# Patient Record
Sex: Male | Born: 1995 | Race: White | Hispanic: No | Marital: Single
Health system: Southern US, Community
[De-identification: ages and names within clinical notes are randomized; demographics above are authoritative.]

---
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
## 2016-03-02 IMAGING — CR DG CHEST 2V
2 series · 2 of 2 positions shown · non-contrast
Comparison: None.

CLINICAL DATA: Chest swelling

EXAM:
CHEST  2 VIEW

[w chest pa]
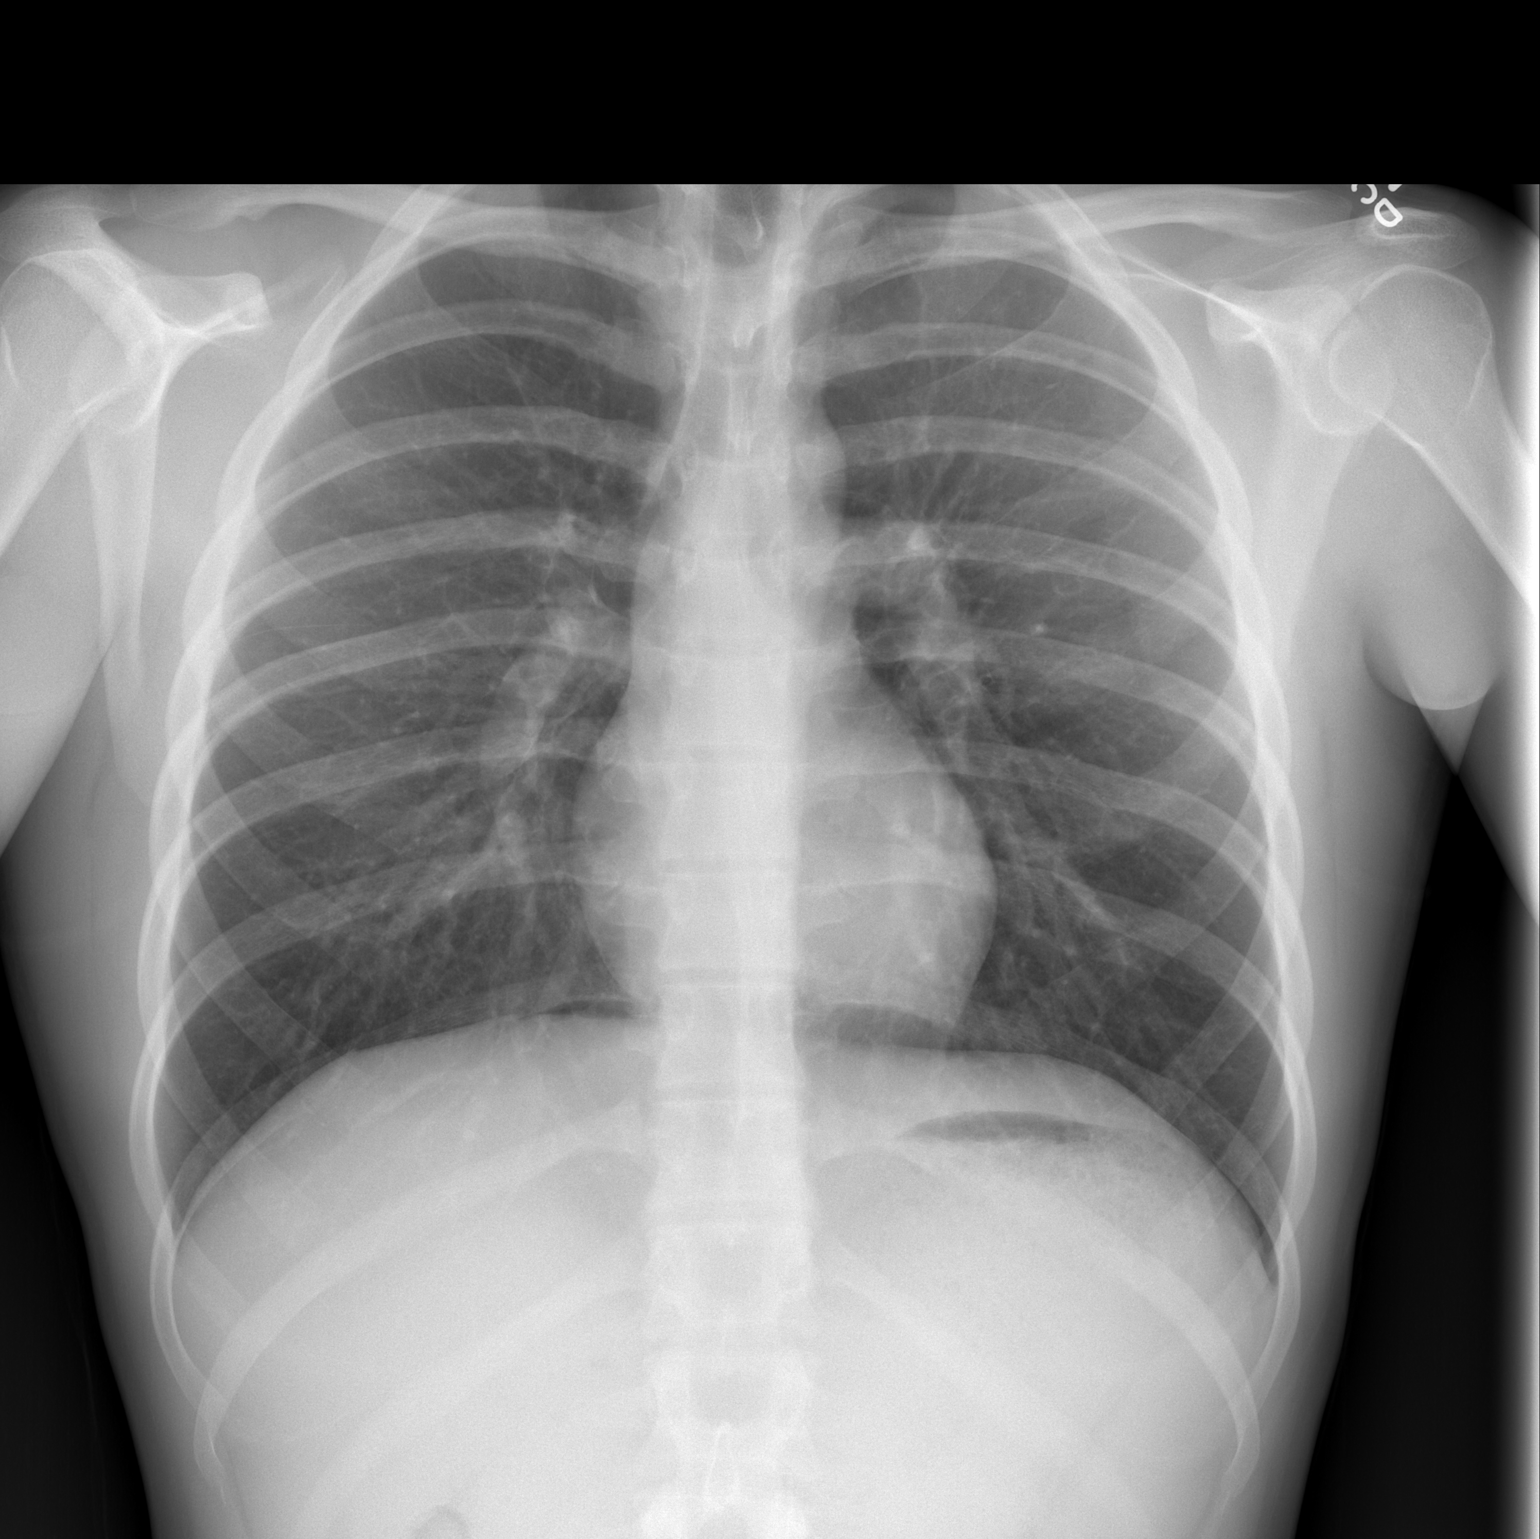

[w chest lat]
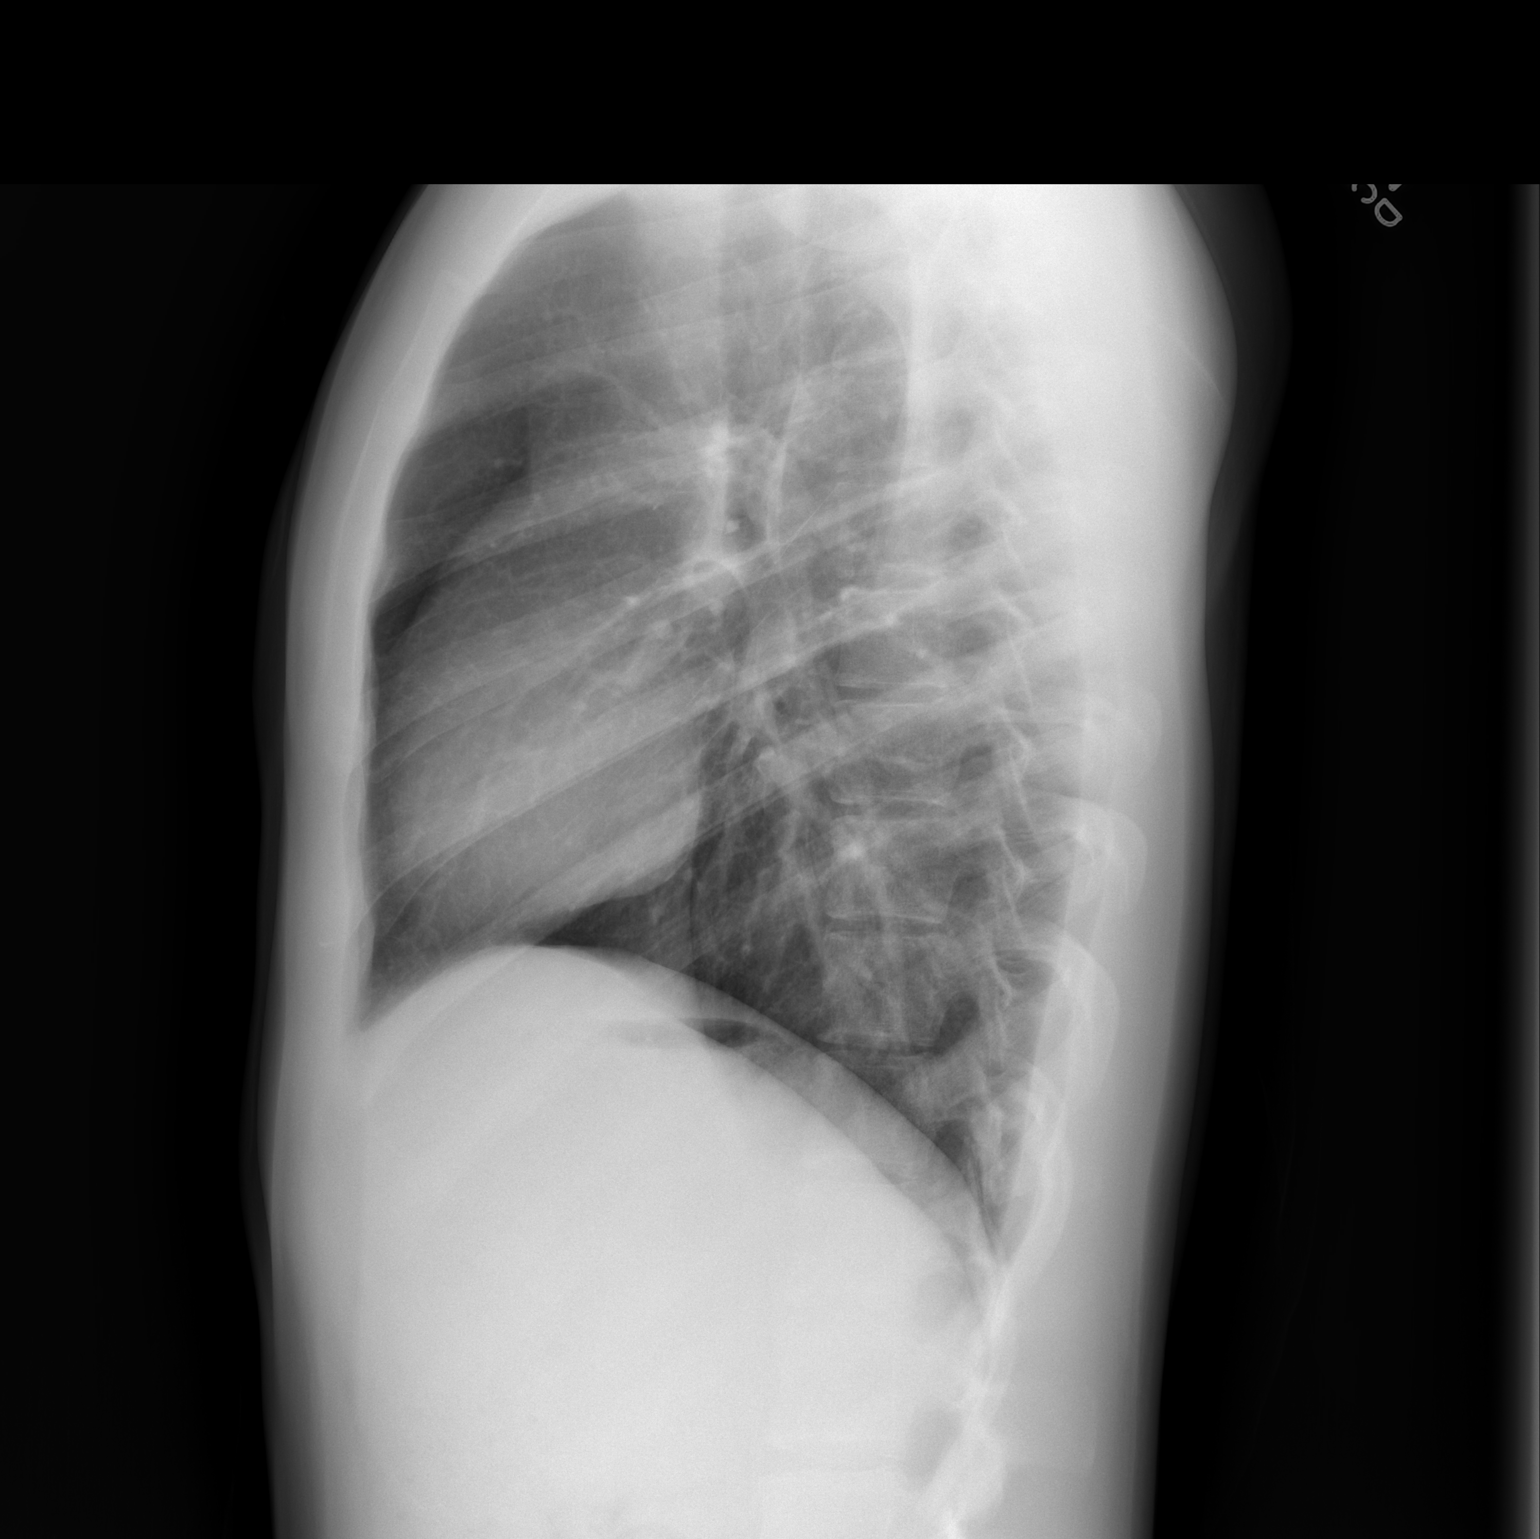

[2 of 2 positions shown; findings below may reference images not displayed]

FINDINGS: Lungs are clear. Heart size and pulmonary vascularity are normal. No
adenopathy. No pneumothorax. No bone near soft tissue abnormality
noted.
IMPRESSION: No abnormality noted.

## 2016-03-06 IMAGING — US US CHEST/MEDIASTINUM
1 series · 13 of 13 positions shown · non-contrast
Comparison: None.

CLINICAL DATA: Left-sided chest wall swelling and discomfort

EXAM:
CHEST ULTRASOUND

[Series 1: us chest/mediastinum · 0.06mm/px · 13 of 13 slices shown]
[im 1/13]
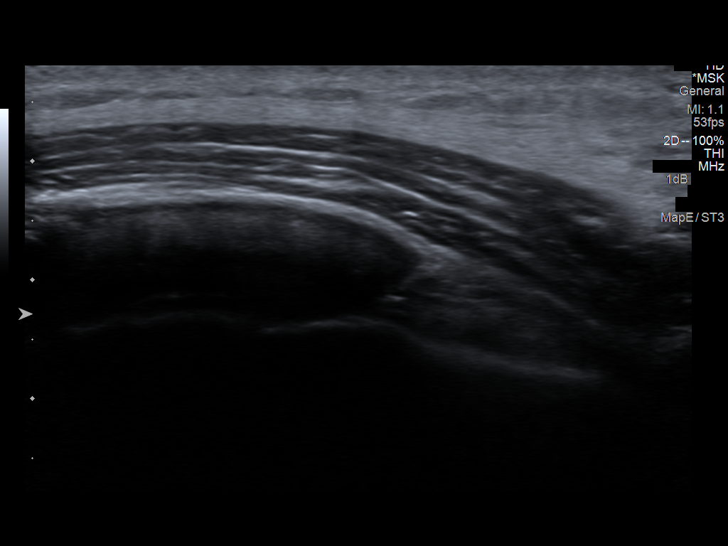
[im 2/13]
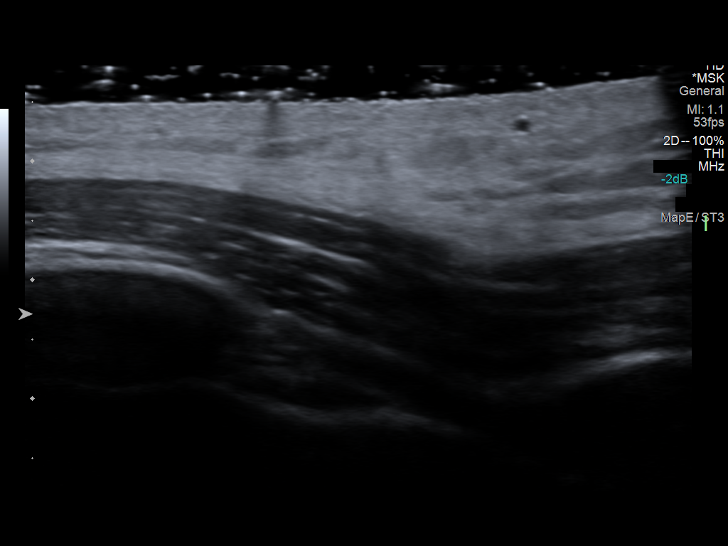
[im 3/13]
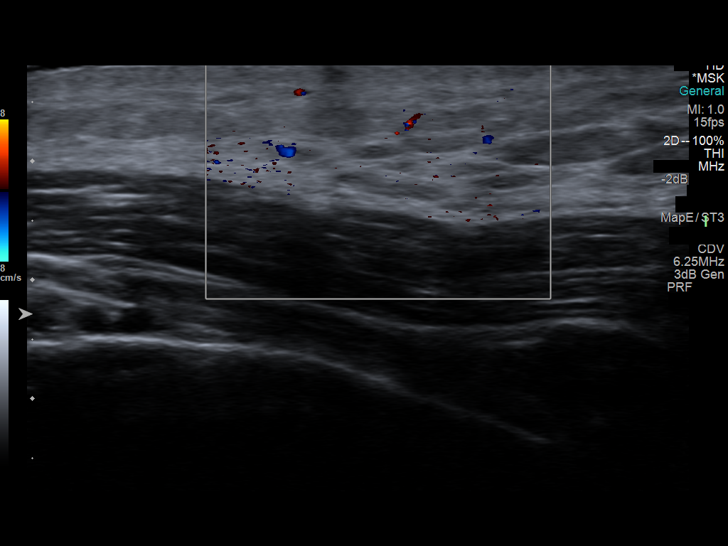
[im 4/13]
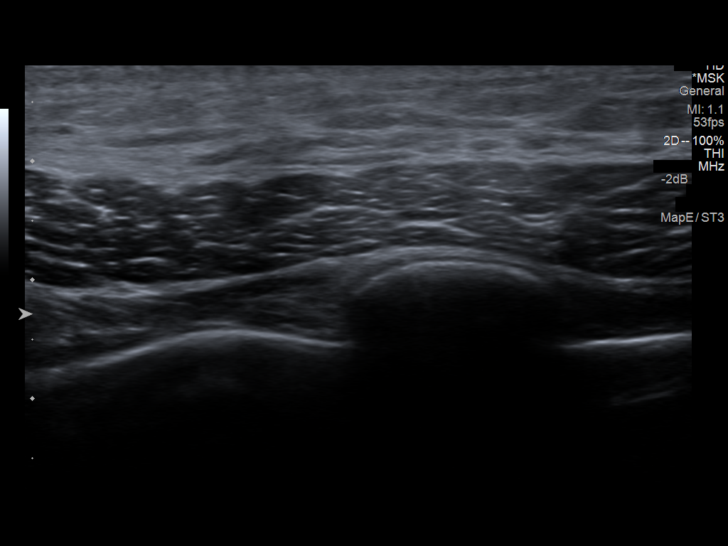
[im 5/13]
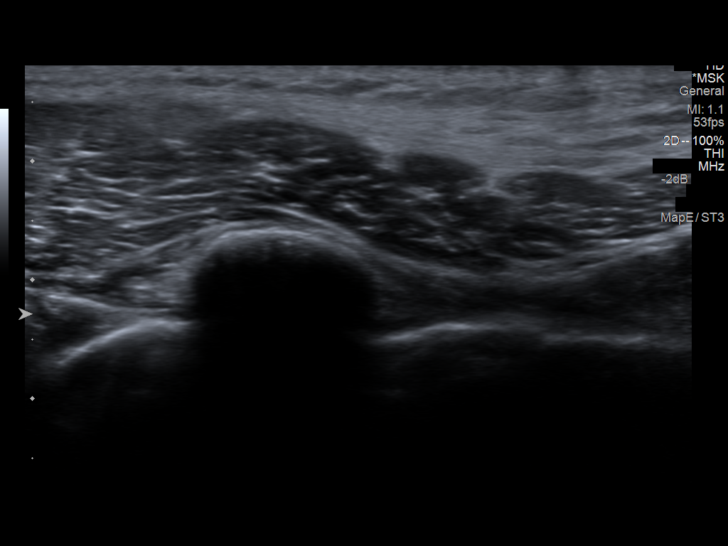
[im 6/13]
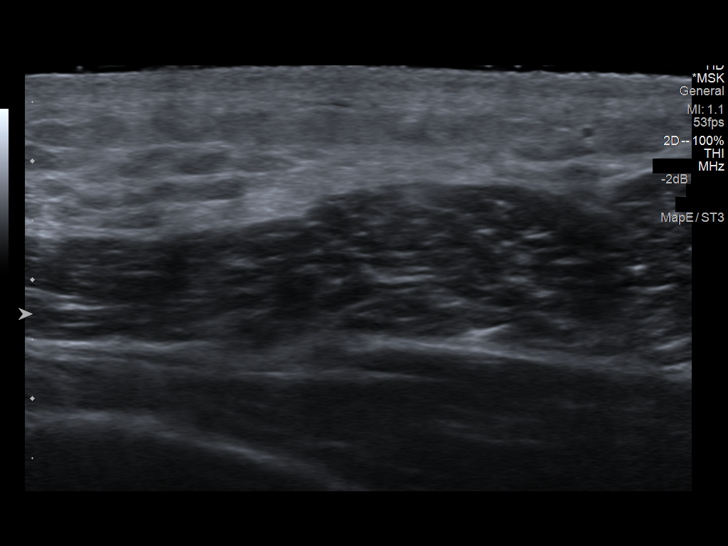
[im 7/13]
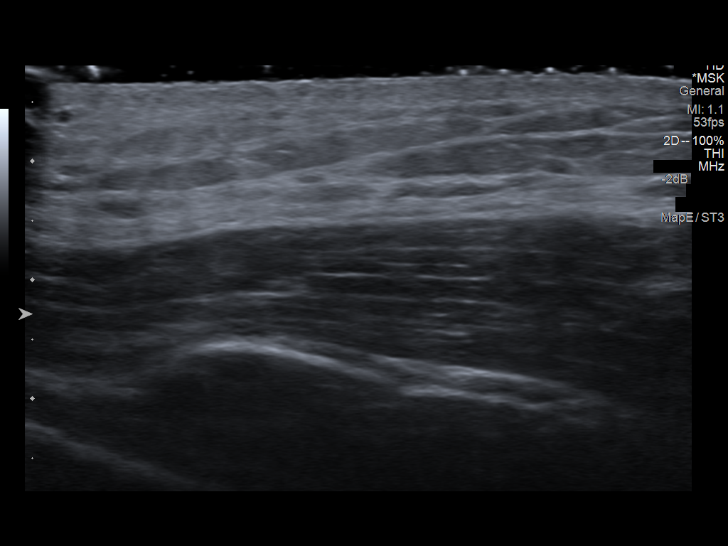
[im 8/13]
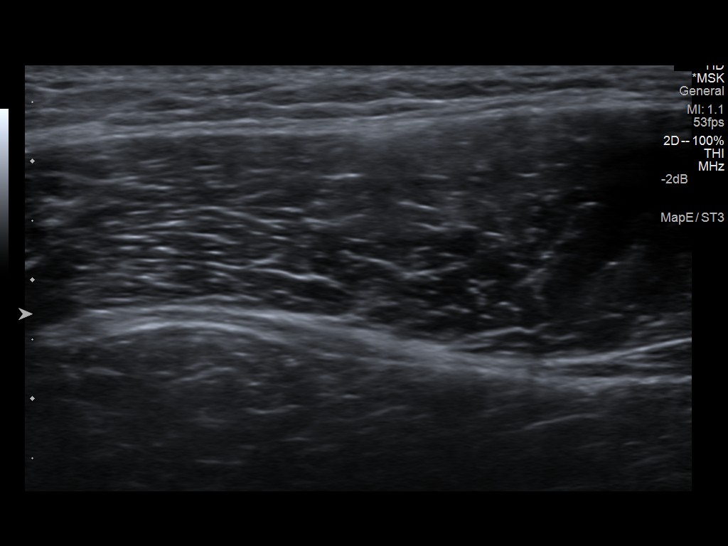
[im 9/13]
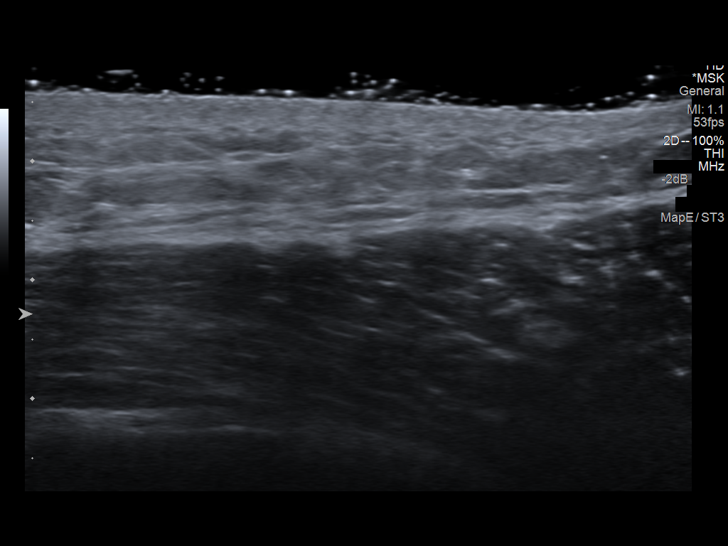
[im 10/13]
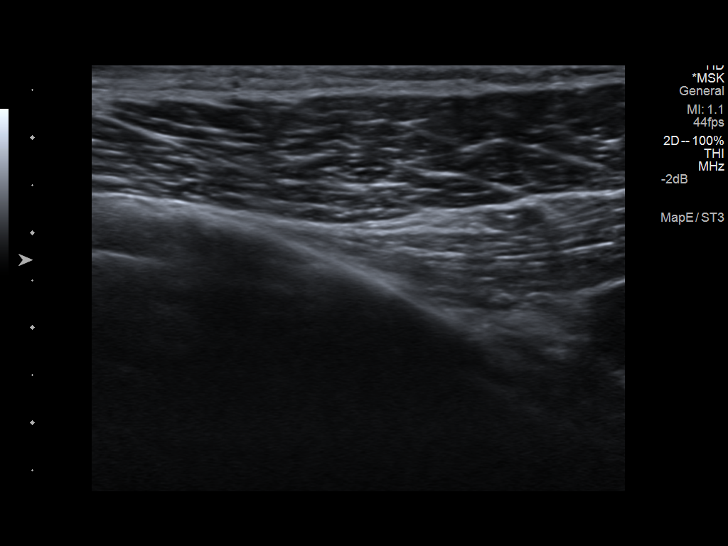
[im 11/13]
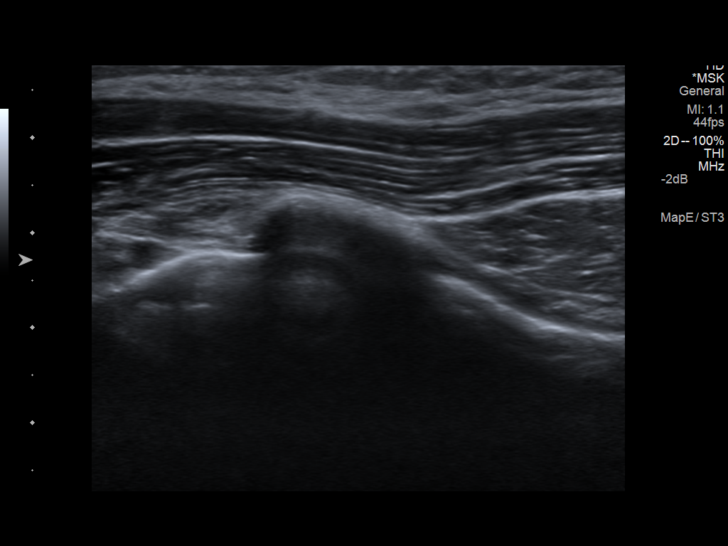
[im 12/13]
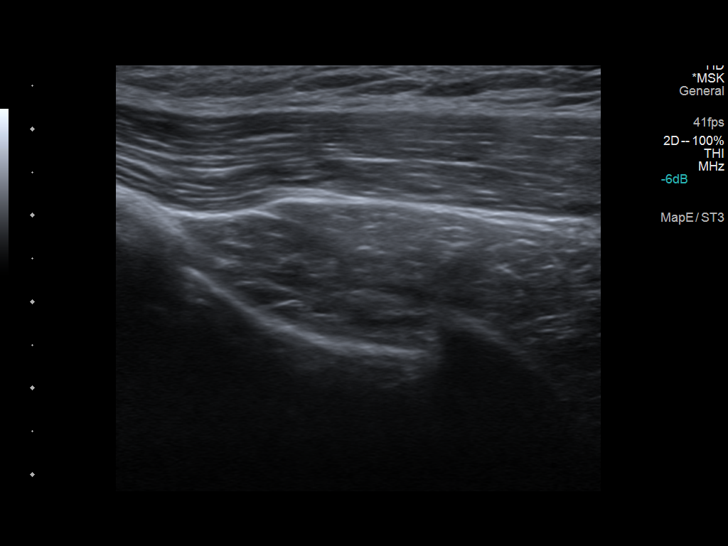
[im 13/13]
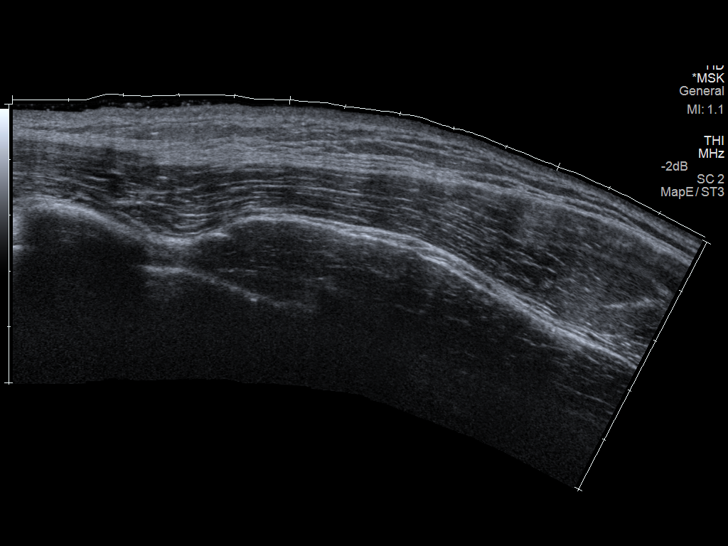

[13 of 13 positions shown; findings below may reference images not displayed]

FINDINGS: No sonographic abnormality. No mass or fluid collection. No
abnormality is seen to correspond to the area of reported swelling
and tenderness.
IMPRESSION: Negative exam.
